# Patient Record
Sex: Male | Born: 1975 | Race: White | Hispanic: No | Marital: Married | Smoking: Current every day smoker
Health system: Southern US, Community
[De-identification: ages and names within clinical notes are randomized; demographics above are authoritative.]

---

## 2008-12-14 ENCOUNTER — Ambulatory Visit: Payer: Self-pay | Admitting: General Practice

## 2009-12-22 ENCOUNTER — Emergency Department: Payer: Self-pay | Admitting: Emergency Medicine

## 2010-08-15 IMAGING — CR RIGHT MIDDLE FINGER 2+V
1 series · 3 of 3 positions shown · non-contrast
Comparison: none

REASON FOR EXAM: cut finger with meat saw
COMMENTS:

[Series 1: view not recorded · 0.17mm/px · 3 of 3 slices shown]
[im 1/3]
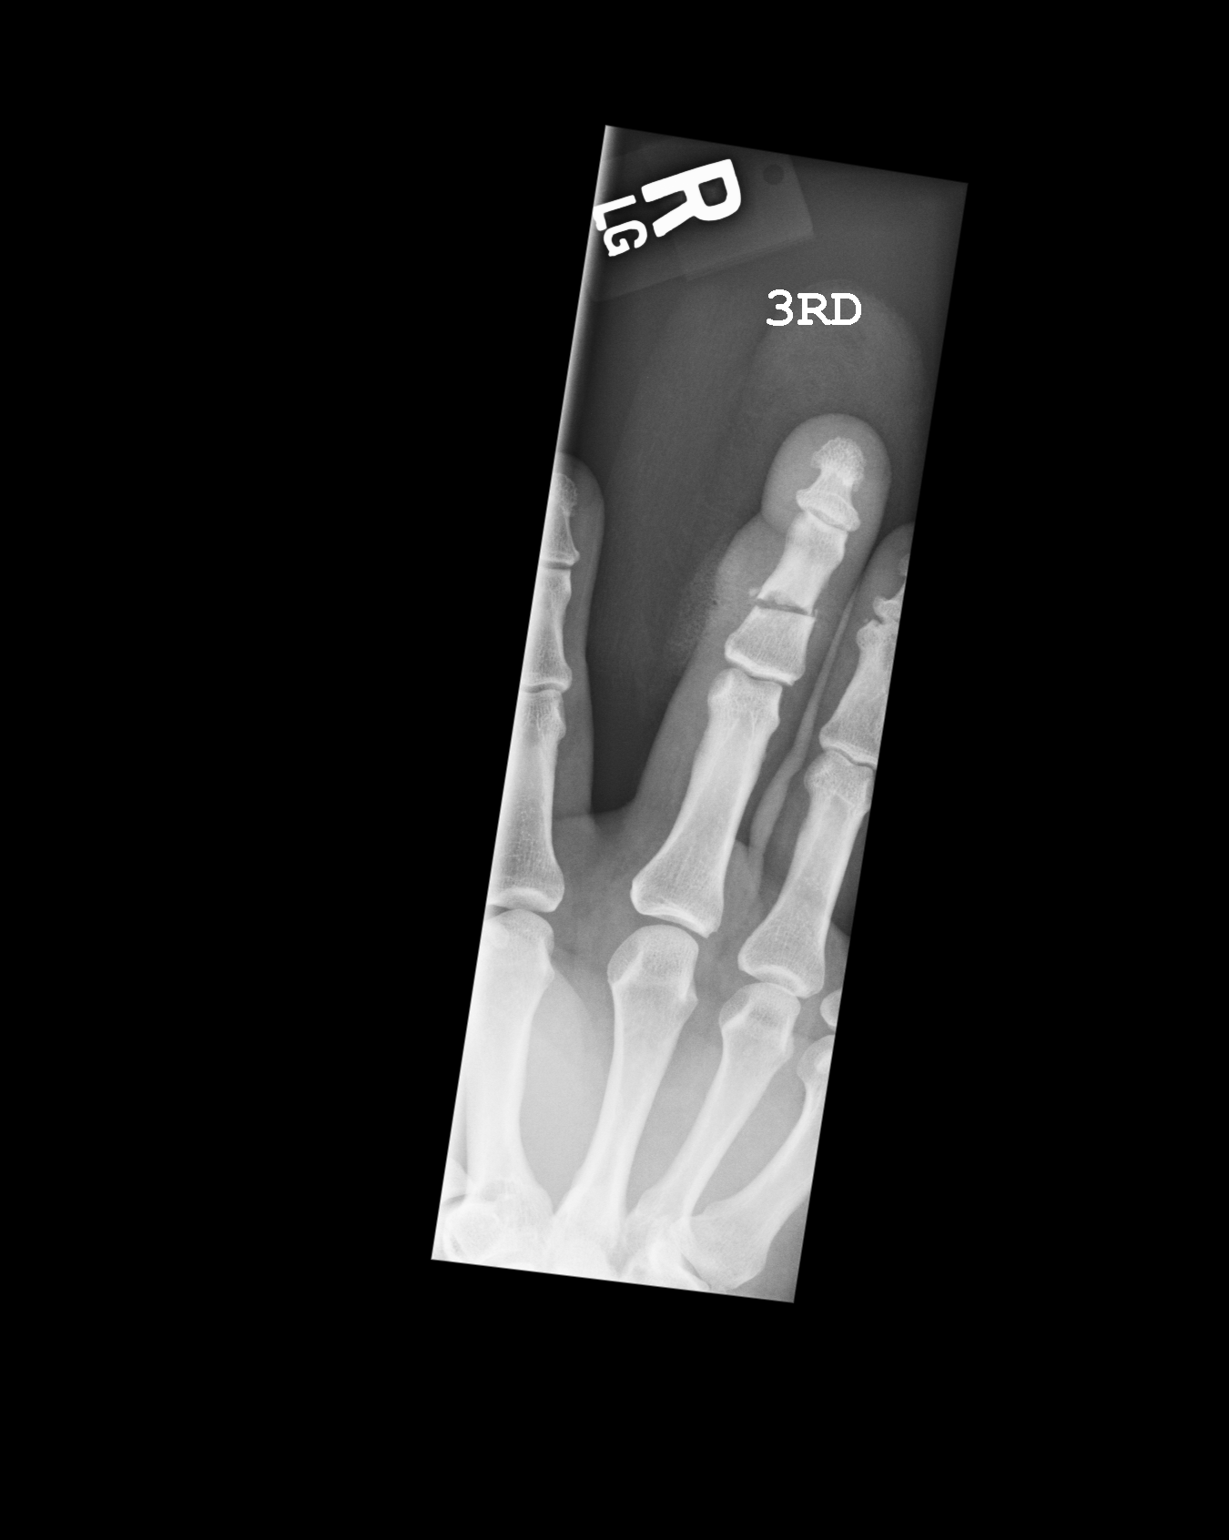
[im 2/3]
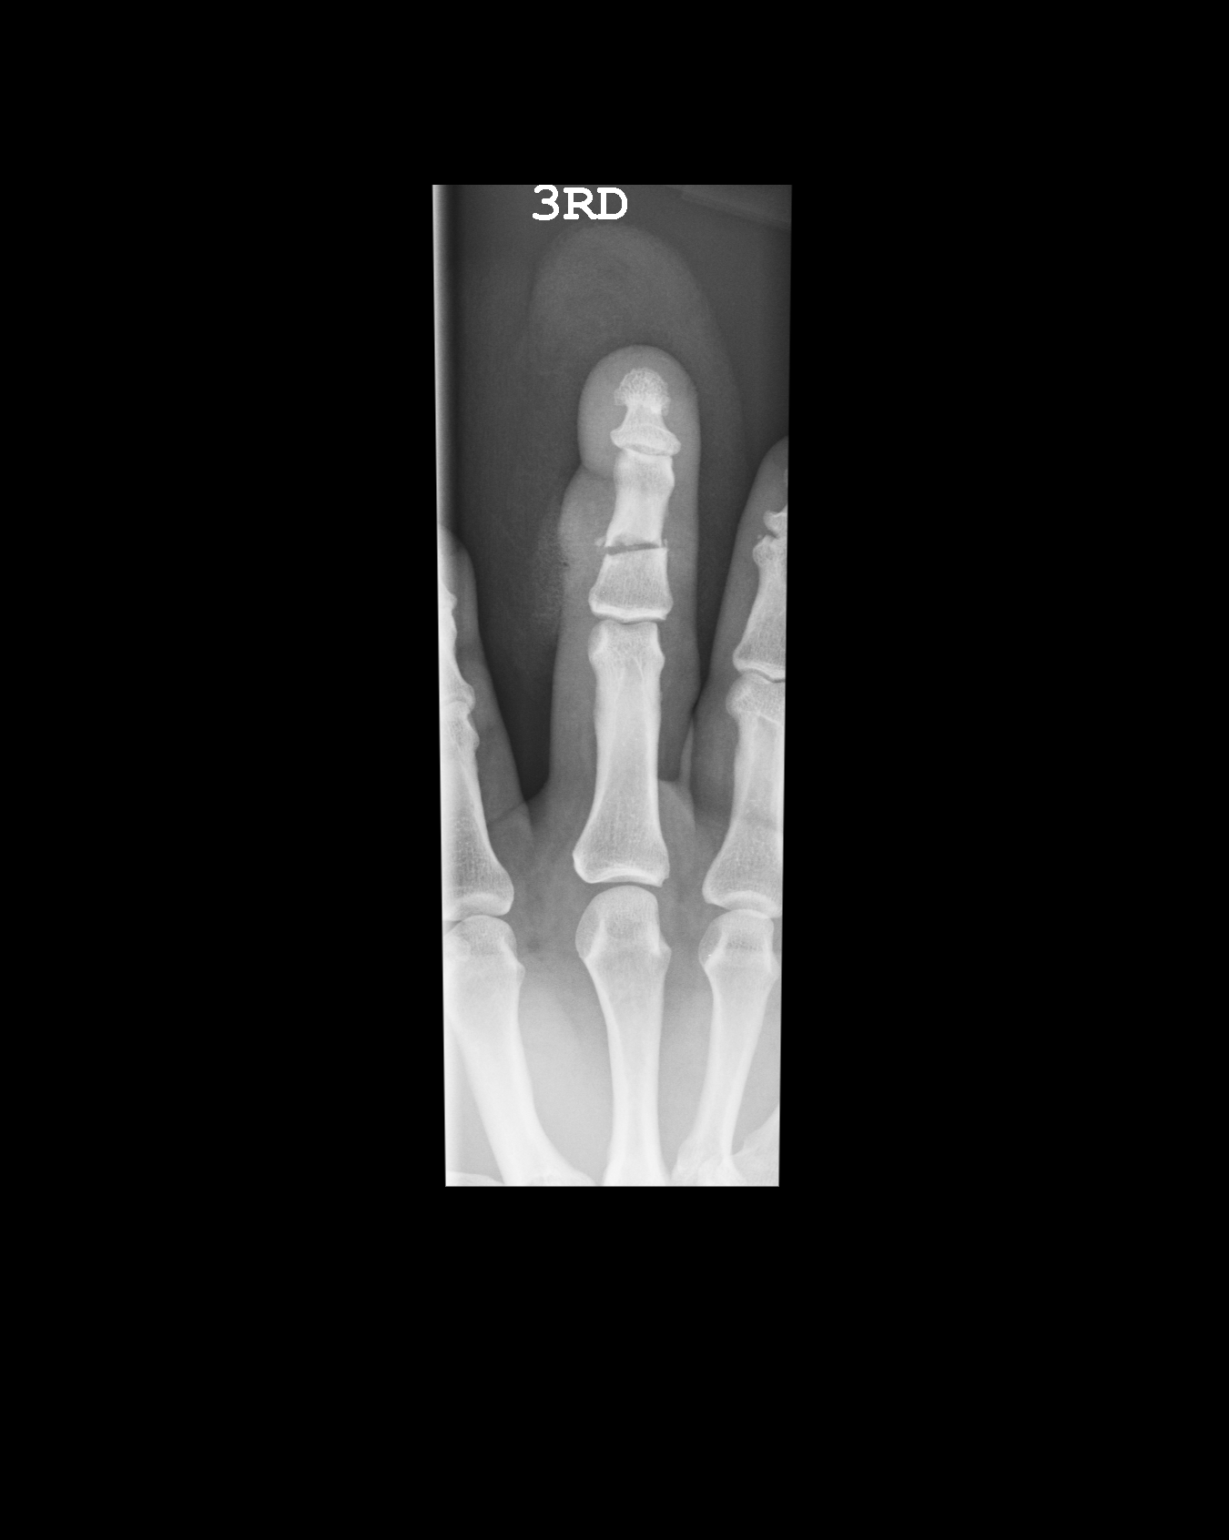
[im 3/3]
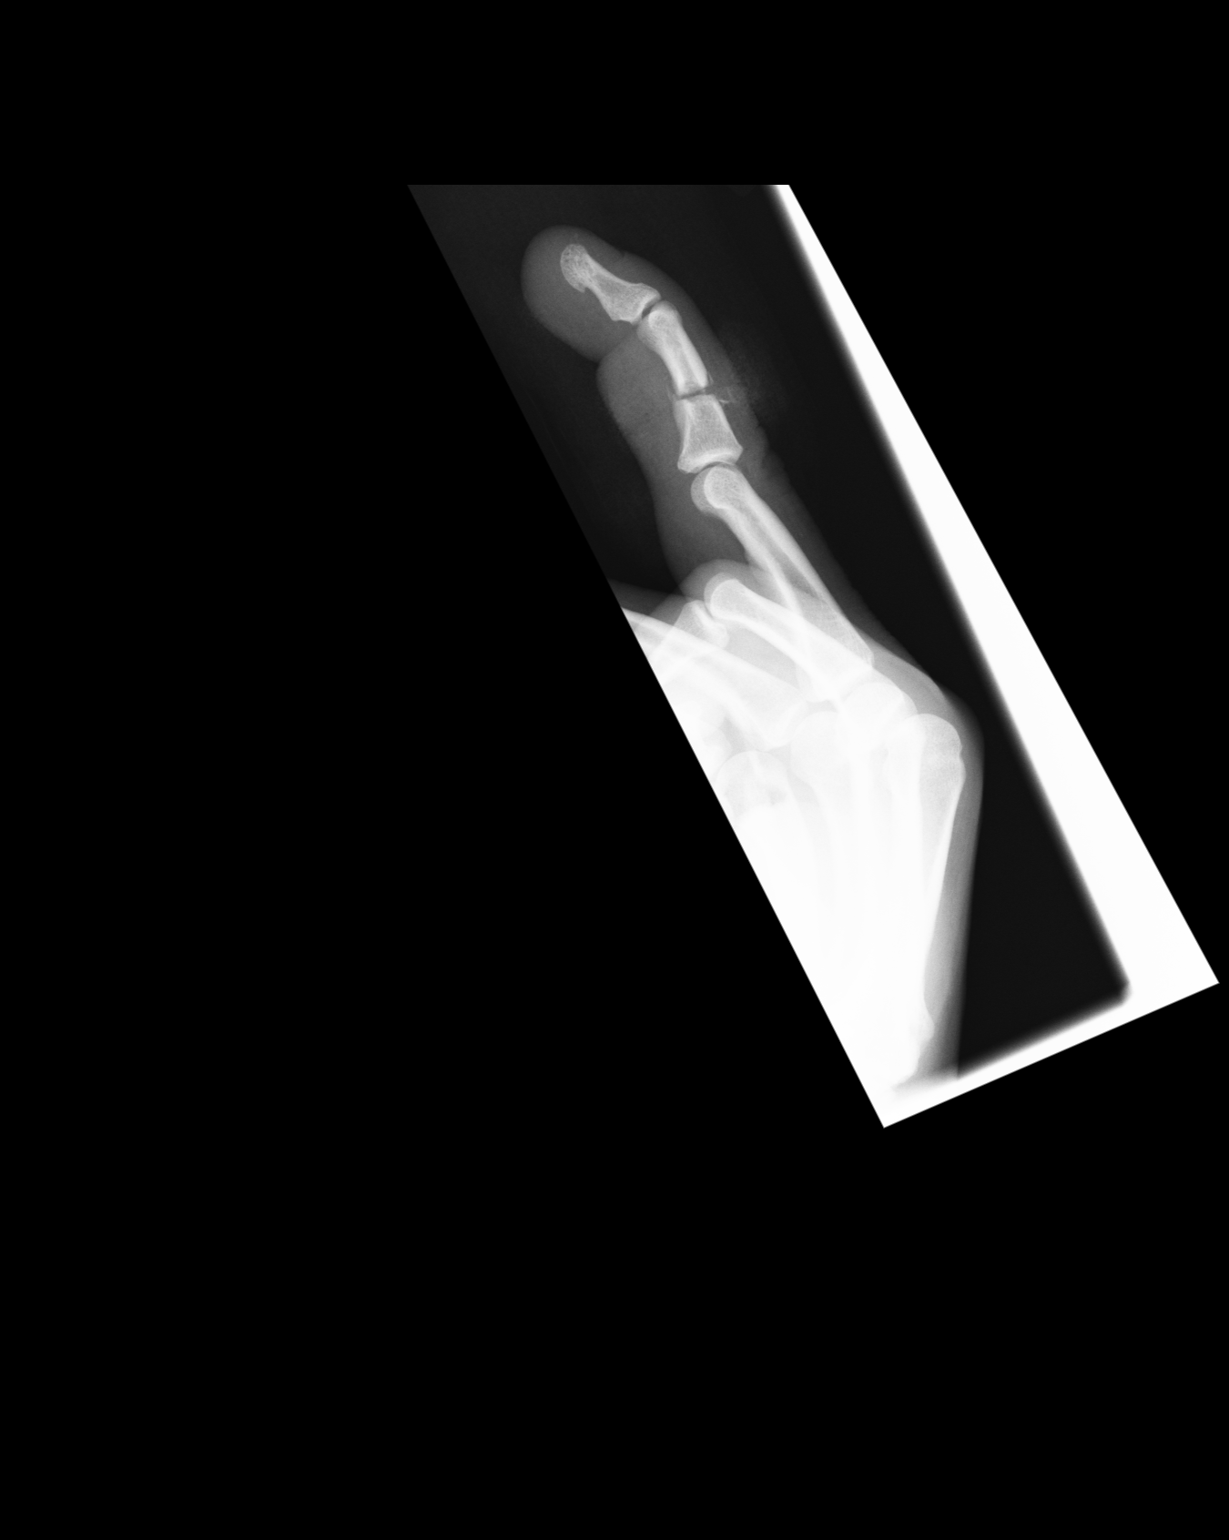

[3 of 3 positions shown; findings below may reference images not displayed]

PROCEDURE:     DXR - DXR FINGER MID 3RD DIGIT RT HAND  - December 14, 2008 [DATE]

RESULT:     There is a slightly comminuted transverse fracture of the shaft
of the middle phalanx of the right, middle finger. Bony fracture components
are minimally displaced. Associated soft tissue injury is noted. There are
small bone fragments in the soft tissues compatible with this being a saw
type injury.
IMPRESSION: Fracture of the middle phalanx as noted above.

## 2010-11-04 ENCOUNTER — Emergency Department: Payer: Self-pay | Admitting: Emergency Medicine

## 2011-02-26 ENCOUNTER — Emergency Department: Payer: Self-pay | Admitting: Emergency Medicine

## 2016-03-05 ENCOUNTER — Emergency Department: Payer: Self-pay

## 2016-03-05 ENCOUNTER — Emergency Department
Admission: EM | Admit: 2016-03-05 | Discharge: 2016-03-05 | Disposition: A | Discharge status: Home / Self Care | Payer: Self-pay | Attending: Emergency Medicine | Admitting: Emergency Medicine

## 2016-03-05 ENCOUNTER — Encounter: Payer: Self-pay | Admitting: Emergency Medicine

## 2016-03-05 DIAGNOSIS — S6000XA Contusion of unspecified finger without damage to nail, initial encounter: Secondary | ICD-10-CM

## 2016-03-05 DIAGNOSIS — F17210 Nicotine dependence, cigarettes, uncomplicated: Secondary | ICD-10-CM | POA: Insufficient documentation

## 2016-03-05 DIAGNOSIS — Y92017 Garden or yard in single-family (private) house as the place of occurrence of the external cause: Secondary | ICD-10-CM | POA: Insufficient documentation

## 2016-03-05 DIAGNOSIS — Y990 Civilian activity done for income or pay: Secondary | ICD-10-CM | POA: Insufficient documentation

## 2016-03-05 DIAGNOSIS — S60041A Contusion of right ring finger without damage to nail, initial encounter: Secondary | ICD-10-CM | POA: Insufficient documentation

## 2016-03-05 DIAGNOSIS — W231XXA Caught, crushed, jammed, or pinched between stationary objects, initial encounter: Secondary | ICD-10-CM | POA: Insufficient documentation

## 2016-03-05 DIAGNOSIS — Y9389 Activity, other specified: Secondary | ICD-10-CM | POA: Insufficient documentation

## 2016-03-05 MED ORDER — IBUPROFEN 800 MG PO TABS: 800.0000 mg | ORAL_TABLET | Freq: Three times a day (TID) | ORAL | Status: AC | PRN

## 2016-03-05 NOTE — ED Notes (Signed)
Pt presents with swollen right hand, especially ring finger and base area. Pt states he thinks he "jammed it" but doesn't remember an injury. 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

## 2016-03-05 NOTE — ED Provider Notes (Signed)
[facility] Emergency Department Provider Note  ____________________________________________  Time seen: Approximately 1:47 PM  I have reviewed the triage vital signs and the nursing notes.   HISTORY  Chief Complaint Hand Pain    HPI Brandon Sheppard is a 40 y.o. male presents with complaints of swollen right fourth finger. Patient states he thinks he jammed it is unremarkable. Presents for evaluation and needs a note for work.   History reviewed. No pertinent past medical history.  There are no active problems to display for this patient.   History reviewed. No pertinent past surgical history.  Current Outpatient Rx  Name  Route  Sig  Dispense  Refill  . ibuprofen (ADVIL,MOTRIN) 800 MG tablet   Oral   Take 1 tablet (800 mg total) by mouth every 8 (eight) hours as needed.   30 tablet   0     Allergies Review of patient's allergies indicates no known allergies.  No family history on file.  Social History Social History  Substance Use Topics  . Smoking status: Current Every Day Smoker -- 0.50 packs/day    Types: Cigarettes  . Smokeless tobacco: None  . Alcohol Use: Yes     Comment: sometimes    Review of Systems Constitutional: No fever/chills Musculoskeletal: Positive right fourth finger pain. Skin: Negative for rash. Neurological: Negative for headaches, focal weakness or numbness.  10-point ROS otherwise negative.  ____________________________________________   PHYSICAL EXAM:  VITAL SIGNS: ED Triage Vitals  Enc Vitals Group     BP 03/05/16 1325 162/108 mmHg     Pulse Rate 03/05/16 1325 70     Resp 03/05/16 1325 18     Temp 03/05/16 1325 98.1 F (36.7 C)     Temp Source 03/05/16 1325 Oral     SpO2 03/05/16 1325 97 %     Weight 03/05/16 1325 235 lb (106.595 kg)     Height 03/05/16 1325 6' (1.829 m)     Head Cir --      Peak Flow --      Pain Score 03/05/16 1325 2     Pain Loc --      Pain Edu? --      Excl. in GC? --      Constitutional: Alert and oriented. Well appearing and in no acute distress. Musculoskeletal: Right hand, fourth finger with obvious edema and tenderness. Limited range of motion increased pain with flexion. Still he neurovascularly intact. Good capillary refill. Neurologic:  Normal speech and language. No gross focal neurologic deficits are appreciated. No gait instability. Skin:  Skin is warm, dry and intact. No rash noted. Psychiatric: Mood and affect are normal. Speech and behavior are normal.  ____________________________________________   LABS (all labs ordered are listed, but only abnormal results are displayed)  Labs Reviewed - No data to display ____________________________________________  EKG   ____________________________________________  RADIOLOGY  No acute osseous findings. ____________________________________________   PROCEDURES  Procedure(s) performed: None  Critical Care performed: No  ____________________________________________   INITIAL IMPRESSION / ASSESSMENT AND PLAN / ED COURSE  Pertinent labs & imaging results that were available during my care of the patient were reviewed by me and considered in my medical decision making (see chart for details).  Contusion right fourth finger. Work excuse 24 hours given Rx given for Motrin 800 mg 3 times a day for pain. Follow up with PCP or return here with any worsening symptomology. ____________________________________________   FINAL CLINICAL IMPRESSION(S) / ED DIAGNOSES  Final diagnoses:  Finger contusion, initial encounter     This chart was dictated using voice recognition software/Dragon. Despite best efforts to proofread, errors can occur which can change the meaning. Any change was purely unintentional.   Evangeline Dakinharles M Criss Pallone, PA-C 03/05/16 1454  Jennye MoccasinBrian S Quigley, MD 03/05/16 [phone]

## 2016-03-05 NOTE — Discharge Instructions (Signed)

## 2016-03-05 NOTE — ED Notes (Signed)
Patient presents to the ED with right hand pain since yesterday.  Patient reports some swelling and pain after working out in the yard.  Patient states, "I'm really just here for a note for work."  Patient is in no obvious distress at this time.

## 2016-08-06 ENCOUNTER — Emergency Department
Admission: EM | Admit: 2016-08-06 | Discharge: 2016-08-06 | Disposition: A | Discharge status: Home / Self Care | Payer: Worker's Compensation | Attending: Emergency Medicine | Admitting: Emergency Medicine

## 2016-08-06 ENCOUNTER — Encounter: Payer: Self-pay | Admitting: Emergency Medicine

## 2016-08-06 ENCOUNTER — Emergency Department: Payer: Worker's Compensation

## 2016-08-06 DIAGNOSIS — Z791 Long term (current) use of non-steroidal anti-inflammatories (NSAID): Secondary | ICD-10-CM | POA: Insufficient documentation

## 2016-08-06 DIAGNOSIS — Y929 Unspecified place or not applicable: Secondary | ICD-10-CM | POA: Insufficient documentation

## 2016-08-06 DIAGNOSIS — F17210 Nicotine dependence, cigarettes, uncomplicated: Secondary | ICD-10-CM | POA: Insufficient documentation

## 2016-08-06 DIAGNOSIS — S2232XA Fracture of one rib, left side, initial encounter for closed fracture: Secondary | ICD-10-CM

## 2016-08-06 DIAGNOSIS — Y9389 Activity, other specified: Secondary | ICD-10-CM | POA: Insufficient documentation

## 2016-08-06 DIAGNOSIS — Y990 Civilian activity done for income or pay: Secondary | ICD-10-CM | POA: Insufficient documentation

## 2016-08-06 DIAGNOSIS — W5522XA Struck by cow, initial encounter: Secondary | ICD-10-CM | POA: Insufficient documentation

## 2016-08-06 NOTE — ED Provider Notes (Signed)
[facility] Emergency Department Provider Note ____________________________________________  Time seen: Approximately 9:33 AM  I have reviewed the triage vital signs and the nursing notes.   HISTORY  Chief Complaint Rib Injury    HPI Brandon Sheppard is a 40 y.o. male that presents with left rib pain after getting pinned by a bull last Thursday. Patient has broken several ribs in the past and is concerned for additional broken ribs. Patient denies CP, SOB, or cough.  Patient has not taken anything for pain.   History reviewed. No pertinent past medical history.  There are no active problems to display for this patient.   History reviewed. No pertinent surgical history.  Prior to Admission medications   Medication Sig Start Date End Date Taking? Authorizing Provider  ibuprofen (ADVIL,MOTRIN) 800 MG tablet Take 1 tablet (800 mg total) by mouth every 8 (eight) hours as needed. 03/05/16   Evangeline Dakinharles M Beers, PA-C    Allergies Patient has no known allergies.  No family history on file.  Social History Social History  Substance Use Topics  . Smoking status: Current Every Day Smoker    Packs/day: 0.50    Types: Cigarettes  . Smokeless tobacco: Never Used  . Alcohol use Yes     Comment: sometimes    Review of Systems Constitutional: No recent illness. Cardiovascular: Denies chest pain or palpitations. Respiratory: Denies shortness of breath. Abdominal: No abdominal pain. No nausea or vomiting  Skin: Negative for rash, wound, lesion. Neurological: Negative for focal weakness or numbness.  ____________________________________________   PHYSICAL EXAM:  VITAL SIGNS: ED Triage Vitals  Enc Vitals Group     BP 08/06/16 0848 (!) 140/97     Pulse Rate 08/06/16 0848 73     Resp 08/06/16 0848 16     Temp 08/06/16 0848 97.9 F (36.6 C)     Temp Source 08/06/16 0848 Oral     SpO2 08/06/16 0848 98 %     Weight 08/06/16 0848 234 lb (106.1 kg)     Height  08/06/16 0848 6\' 1"  (1.854 m)     Head Circumference --      Peak Flow --      Pain Score 08/06/16 0849 3     Pain Loc --      Pain Edu? --      Excl. in GC? --     Constitutional: Alert and oriented. Well appearing and in no acute distress. Eyes: Conjunctivae are normal. EOMI. Head: Atraumatic. Neck: No stridor.  Respiratory: Normal respiratory effort.  Lungs CTAB.  Musculoskeletal: Mild tenderness to palpation over left chest wall.  Neurologic:  Normal speech and language. No gross focal neurologic deficits are appreciated. Speech is normal. No gait instability. Skin:  Skin is warm, dry and intact. Atraumatic. Psychiatric: Mood and affect are normal. Speech and behavior are normal.  ____________________________________________   LABS (all labs ordered are listed, but only abnormal results are displayed)  Labs Reviewed - No data to display ____________________________________________  RADIOLOGY  I, Enid DerryAshley Everly Rubalcava, personally viewed and evaluated these images (plain radiographs) as part of my medical decision making, as well as reviewing the written report by the radiologist.  Recent posterolateral rib fracture of left 6th rib per radiology.   INITIAL IMPRESSION / ASSESSMENT AND PLAN / ED COURSE  Clinical Course     Pertinent labs & imaging results that were available during my care of the patient were reviewed by me and considered in my medical decision making (see  chart for details).  Xray was performed in clinic because injury happened on job. No respiratory symptoms and lungs CTAB so pneumothorax unlikely. Discussed that management would not change with patient if xray showed rib fracture.   FINAL CLINICAL IMPRESSION(S) / ED DIAGNOSES  Final diagnoses:  Closed fracture of one rib of left side, initial encounter      Enid Derryshley Burris Matherne, PA-C 08/06/16 1515    Arnaldo NatalPaul F Malinda, MD 08/08/16 [phone]

## 2016-08-06 NOTE — ED Notes (Signed)
See triage note  States he got pinned between a gate and a bull about 5 days ago  conts to have left rib pain  Pain increases with inspiration

## 2016-08-06 NOTE — ED Triage Notes (Signed)
5 days ago pt was at work and got pinned between a gate and a bull.  Pain in left ribs since. Worse with deep breathing or cough.  No resp distress.  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

## 2018-04-07 IMAGING — CR DG RIBS W/ CHEST 3+V*L*
1 series · 5 of 5 positions shown · non-contrast
Comparison: None.

CLINICAL DATA: Pinned by bull 4 days prior

EXAM:
LEFT RIBS AND CHEST - 3+ VIEW

[Series 1: dg ribs unilateral w/chest left · 0.14mm/px · 5 of 5 slices shown]
[im 1/5]
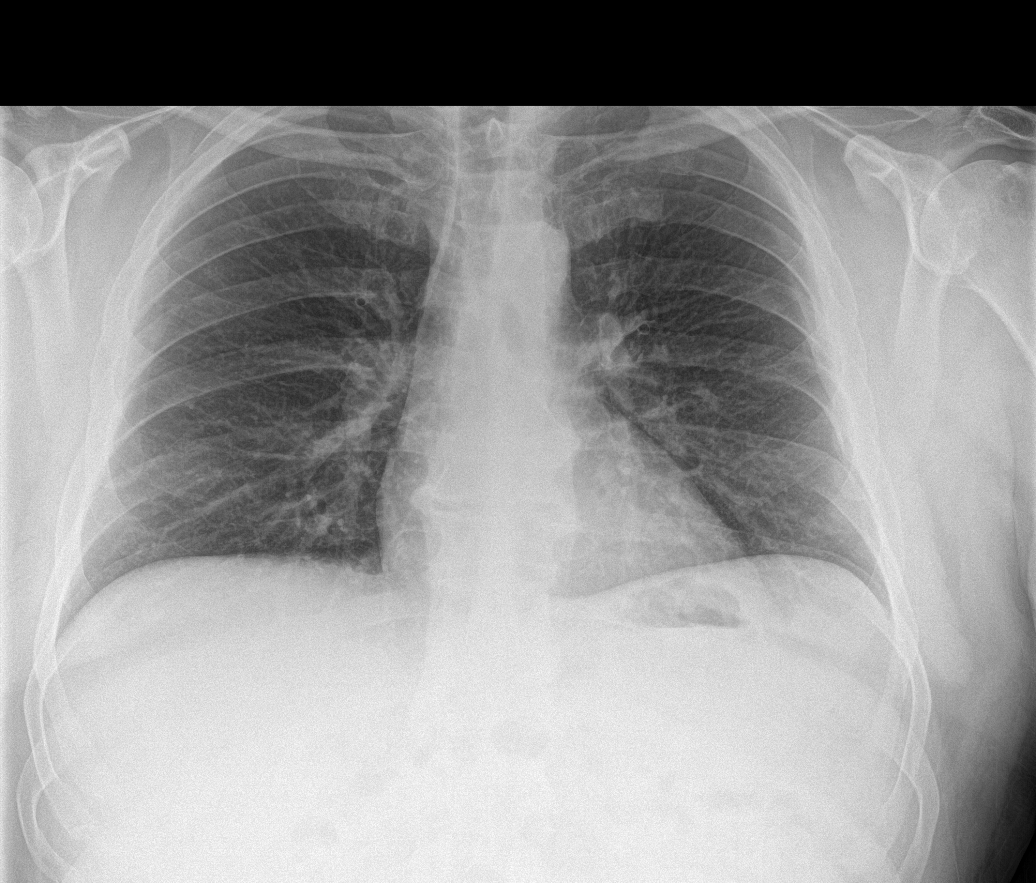
[im 2/5]
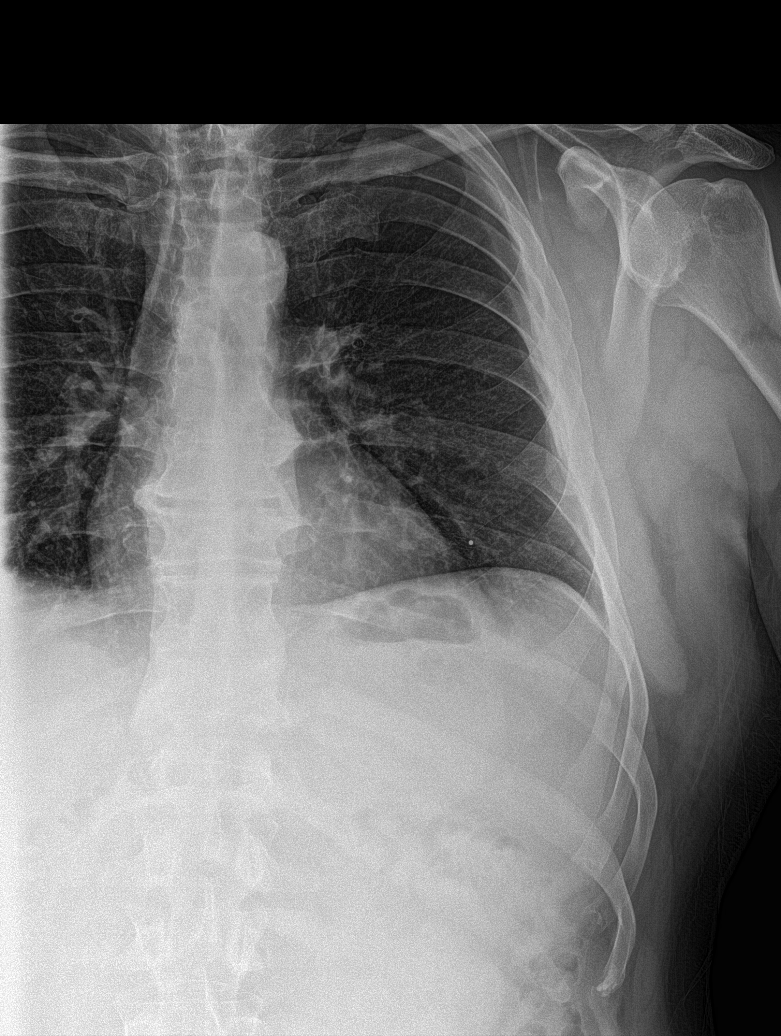
[im 3/5]
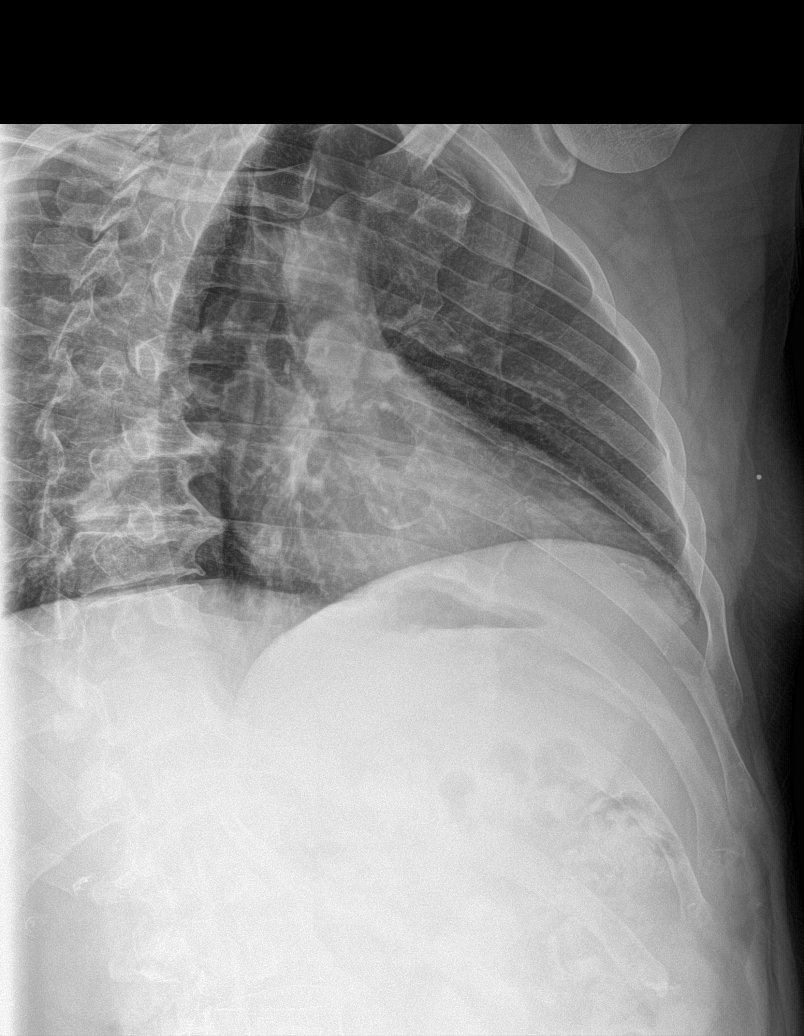
[im 4/5]
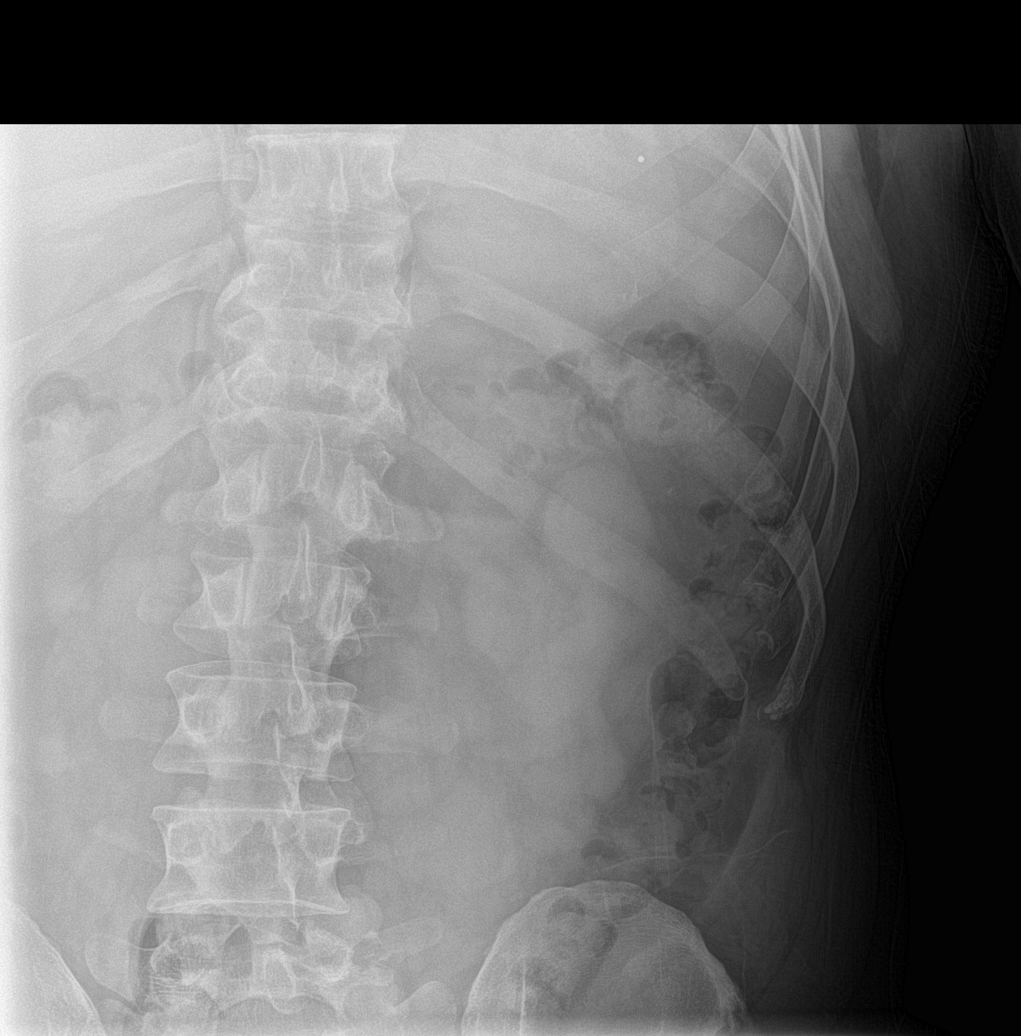
[im 5/5]
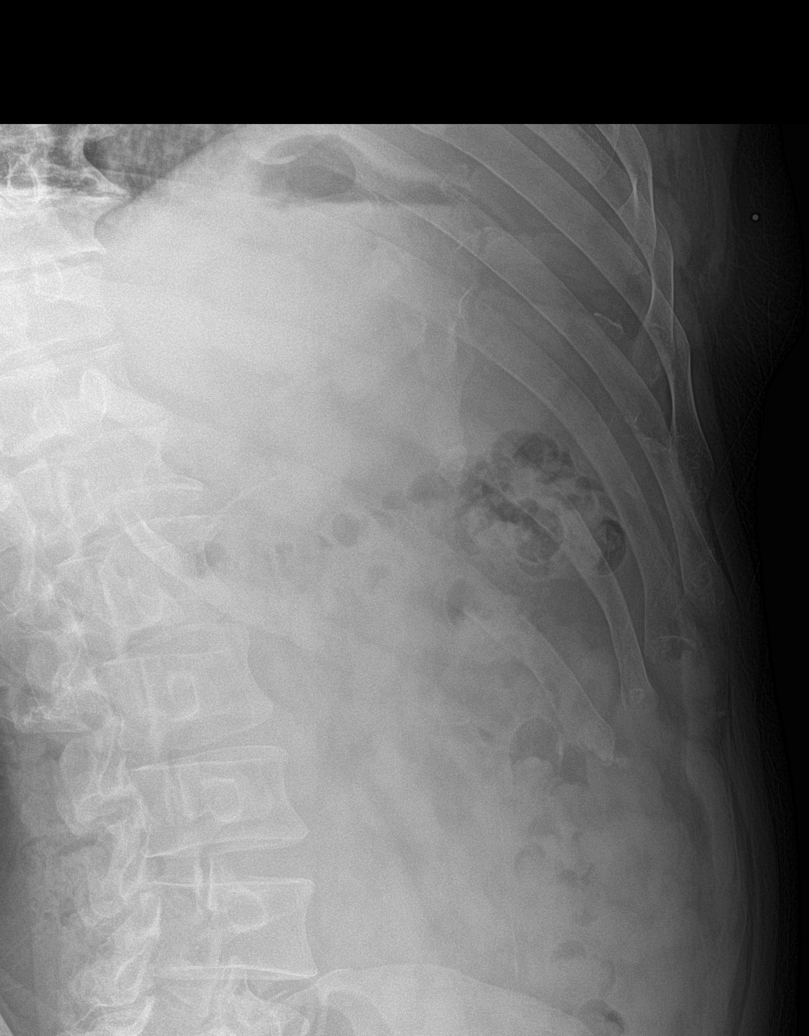

[5 of 5 positions shown; findings below may reference images not displayed]

FINDINGS: Frontal chest as well as oblique and cone-down lateral rib images
obtained. Lungs are clear. Heart size and pulmonary vascularity are
normal. No adenopathy. There is no evident pneumothorax or pleural
effusion. There are healed fractures of the postero lateral left
third, fourth, fifth, sixth, and seventh ribs. There is a suspected
a more acute superimposed fracture of the posterolateral much sixth
rib.
IMPRESSION: Multiple healed left-sided rib fractures. Suspect more recent
fracture posterolateral left sixth rib superimposed on chronic
injury. No pneumothorax. Lungs clear.
